# Patient Record
Sex: Female | Born: 1955 | Race: White | Hispanic: No | Marital: Married | Smoking: Former smoker
Health system: Southern US, Community
[De-identification: ages and names within clinical notes are randomized; demographics above are authoritative.]

## PROBLEM LIST (undated history)

## (undated) DIAGNOSIS — F419 Anxiety disorder, unspecified: Secondary | ICD-10-CM

## (undated) DIAGNOSIS — G459 Transient cerebral ischemic attack, unspecified: Secondary | ICD-10-CM

## (undated) DIAGNOSIS — I10 Essential (primary) hypertension: Secondary | ICD-10-CM

## (undated) DIAGNOSIS — Z85118 Personal history of other malignant neoplasm of bronchus and lung: Secondary | ICD-10-CM

## (undated) DIAGNOSIS — Z5189 Encounter for other specified aftercare: Secondary | ICD-10-CM

## (undated) DIAGNOSIS — C801 Malignant (primary) neoplasm, unspecified: Secondary | ICD-10-CM

## (undated) HISTORY — PX: BILATERAL OOPHORECTOMY: SHX1221

## (undated) HISTORY — PX: LUNG LOBECTOMY: SHX167

## (undated) HISTORY — PX: ABDOMINAL HYSTERECTOMY: SHX81

## (undated) HISTORY — PX: APPENDECTOMY: SHX54

---

## 2008-06-18 ENCOUNTER — Ambulatory Visit: Payer: Self-pay | Admitting: Thoracic Surgery (Cardiothoracic Vascular Surgery)

## 2016-10-06 ENCOUNTER — Emergency Department (HOSPITAL_BASED_OUTPATIENT_CLINIC_OR_DEPARTMENT_OTHER): Payer: BLUE CROSS/BLUE SHIELD

## 2016-10-06 ENCOUNTER — Encounter (HOSPITAL_BASED_OUTPATIENT_CLINIC_OR_DEPARTMENT_OTHER): Payer: Self-pay | Admitting: *Deleted

## 2016-10-06 ENCOUNTER — Emergency Department (HOSPITAL_BASED_OUTPATIENT_CLINIC_OR_DEPARTMENT_OTHER)
Admission: EM | Admit: 2016-10-06 | Discharge: 2016-10-06 | Disposition: A | Discharge status: Home / Self Care | Payer: BLUE CROSS/BLUE SHIELD | Attending: Emergency Medicine | Admitting: Emergency Medicine

## 2016-10-06 DIAGNOSIS — Z79899 Other long term (current) drug therapy: Secondary | ICD-10-CM | POA: Insufficient documentation

## 2016-10-06 DIAGNOSIS — I10 Essential (primary) hypertension: Secondary | ICD-10-CM | POA: Diagnosis not present

## 2016-10-06 DIAGNOSIS — R05 Cough: Secondary | ICD-10-CM | POA: Diagnosis present

## 2016-10-06 DIAGNOSIS — R110 Nausea: Secondary | ICD-10-CM | POA: Diagnosis not present

## 2016-10-06 DIAGNOSIS — Z85118 Personal history of other malignant neoplasm of bronchus and lung: Secondary | ICD-10-CM | POA: Diagnosis not present

## 2016-10-06 DIAGNOSIS — Z87891 Personal history of nicotine dependence: Secondary | ICD-10-CM | POA: Diagnosis not present

## 2016-10-06 DIAGNOSIS — R059 Cough, unspecified: Secondary | ICD-10-CM

## 2016-10-06 DIAGNOSIS — J029 Acute pharyngitis, unspecified: Secondary | ICD-10-CM | POA: Diagnosis not present

## 2016-10-06 HISTORY — DX: Malignant (primary) neoplasm, unspecified: C80.1

## 2016-10-06 HISTORY — DX: Essential (primary) hypertension: I10

## 2016-10-06 HISTORY — DX: Transient cerebral ischemic attack, unspecified: G45.9

## 2016-10-06 HISTORY — DX: Encounter for other specified aftercare: Z51.89

## 2016-10-06 HISTORY — DX: Anxiety disorder, unspecified: F41.9

## 2016-10-06 HISTORY — DX: Personal history of other malignant neoplasm of bronchus and lung: Z85.118

## 2016-10-06 LAB — RAPID STREP SCREEN (MED CTR MEBANE ONLY): Streptococcus, Group A Screen (Direct): NEGATIVE

## 2016-10-06 MED ORDER — DM-GUAIFENESIN ER 30-600 MG PO TB12: 1.0000 | ORAL_TABLET | Freq: Two times a day (BID) | ORAL | 0 refills | Status: AC

## 2016-10-06 MED ORDER — ALBUTEROL SULFATE HFA 108 (90 BASE) MCG/ACT IN AERS: 1.0000 | INHALATION_SPRAY | Freq: Four times a day (QID) | RESPIRATORY_TRACT | 0 refills | Status: AC | PRN

## 2016-10-06 MED ORDER — IBUPROFEN 800 MG PO TABS: 800.0000 mg | ORAL_TABLET | Freq: Three times a day (TID) | ORAL | 0 refills | Status: AC

## 2016-10-06 MED FILL — IBUPROFEN 800 MG TABLET: 800 | 7 days supply | Qty: 21 | Fill #0

## 2016-10-06 MED FILL — MUCINEX DM ER 600-30 MG TAB: 30-600 | 10 days supply | Qty: 20 | Fill #0

## 2016-10-06 NOTE — Discharge Instructions (Signed)
Medications: Ibuprofen, Mucinex DM, albuterol inhaler  Treatment: Take ibuprofen every 8 hours as needed for your pain. Take Mucinex twice daily for cough. Use inhaler every 4-6 hours as needed for shortness of breath or wheezing. You can gargle with warm salt water to soothe your throat as well.  Follow-up: Please follow-up with your primary care provider on Monday for follow-up and further evaluation and treatment of your symptoms. Please return to emergency department if you develop any new or worsening symptoms including worsening shortness of breath, fever, chest pain, or any other concerning symptoms.

## 2016-10-06 NOTE — ED Notes (Signed)
Patient transported to X-ray 

## 2016-10-06 NOTE — ED Provider Notes (Signed)
[facility] Provider Note   CSN: [id number] Arrival date & time: 10/06/16  1016     History   Chief Complaint Chief Complaint  Patient presents with  . Cough    HPI Robin Boone is a 60 y.o. female with history of lung cancer and lobectomy, in remission, who presents with a three-day history of sore throat and cough. Patient reports associated mild shortness of breath, worse with speaking and on exertion. Patient reports a burning sensation in her chest with coughing, but denies chest pain. Patient is also had intermittent nausea, however she feels that it is related to her sore throat. She denies any fevers, abdominal pain, vomiting, urinary symptoms. Patient reports that she has a history of strep throat with cough, as well as pneumonia and bronchitis that is exacerbated due to her lobectomy. Patient denies sick contacts.  HPI  Past Medical History:  Diagnosis Date  . Anxiety   . Blood transfusion without reported diagnosis   . Cancer ([place])   . History of lung cancer   . Hypertension   . TIA (transient ischemic attack)     There are no active problems to display for this patient.   Past Surgical History:  Procedure Laterality Date  . ABDOMINAL HYSTERECTOMY    . APPENDECTOMY    . BILATERAL OOPHORECTOMY    . LUNG LOBECTOMY Right     OB History    No data available       Home Medications    Prior to Admission medications   Medication Sig Start Date End Date Taking? Authorizing Provider  ALPRAZolam Duanne Moron) 1 MG tablet Take 1 mg by mouth 3 (three) times daily as needed for anxiety.   Yes Historical Provider, MD  Bepotastine Besilate 1.5 % SOLN    Yes Historical Provider, MD  cycloSPORINE (RESTASIS) 0.05 % ophthalmic emulsion Place 1 drop into both eyes 2 (two) times daily.   Yes Historical Provider, MD  desloratadine (CLARINEX) 5 MG tablet Take 5 mg by mouth daily.   Yes Historical Provider, MD  estradiol (CLIMARA - DOSED IN MG/24 HR) 0.1  mg/24hr patch Place 0.1 mg onto the skin once a week.   Yes Historical Provider, MD  fluticasone (FLONASE) 50 MCG/ACT nasal spray Place into both nostrils as needed for allergies or rhinitis.   Yes Historical Provider, MD  montelukast (SINGULAIR) 10 MG tablet Take 10 mg by mouth at bedtime.   Yes Historical Provider, MD  Multiple Vitamin (MULTIVITAMIN) tablet Take 1 tablet by mouth daily.   Yes Historical Provider, MD  nebivolol (BYSTOLIC) 5 MG tablet Take 5 mg by mouth daily.   Yes Historical Provider, MD  OMEGA-3 FATTY ACIDS PO Take by mouth daily.   Yes Historical Provider, MD  albuterol (PROVENTIL HFA;VENTOLIN HFA) 108 (90 Base) MCG/ACT inhaler Inhale 1-2 puffs into the lungs every 6 (six) hours as needed for wheezing or shortness of breath. 10/06/16   Frederica Kuster, PA-C  dextromethorphan-guaiFENesin (MUCINEX DM) 30-600 MG 12hr tablet Take 1 tablet by mouth 2 (two) times daily. 10/06/16   Frederica Kuster, PA-C  ibuprofen (ADVIL,MOTRIN) 800 MG tablet Take 1 tablet (800 mg total) by mouth 3 (three) times daily. 10/06/16   Frederica Kuster, PA-C    Family History No family history on file.  Social History Social History  Substance Use Topics  . Smoking status: Former Research scientist (life sciences)  . Smokeless tobacco: Never Used  . Alcohol use Yes     Comment: 2-3 daily  Allergies   Taxol [paclitaxel]   Review of Systems Review of Systems  Constitutional: Negative for chills and fever.  HENT: Positive for sore throat. Negative for congestion and facial swelling.   Respiratory: Positive for cough and shortness of breath.   Cardiovascular: Negative for chest pain.  Gastrointestinal: Positive for nausea. Negative for abdominal pain and vomiting.  Genitourinary: Negative for dysuria.  Musculoskeletal: Negative for back pain.  Skin: Negative for rash and wound.  Neurological: Negative for headaches.  Psychiatric/Behavioral: The patient is not nervous/anxious.      Physical Exam Updated Vital  Signs BP 163/86 (BP Location: Left Arm)   Pulse 82   Temp 98.1 F (36.7 C) (Oral)   Resp 18   Ht 5' (1.524 m)   Wt 65.3 kg   SpO2 100%   BMI 28.12 kg/m   Physical Exam  Constitutional: She appears well-developed and well-nourished. No distress.  HENT:  Head: Normocephalic and atraumatic.  Mouth/Throat: Mucous membranes are normal. No trismus in the jaw. No uvula swelling. Oropharyngeal exudate and posterior oropharyngeal erythema present. No posterior oropharyngeal edema or tonsillar abscesses.  Eyes: Conjunctivae are normal. Pupils are equal, round, and reactive to light. Right eye exhibits no discharge. Left eye exhibits no discharge. No scleral icterus.  Neck: Normal range of motion. Neck supple. No thyromegaly present.  Cardiovascular: Normal rate, regular rhythm, normal heart sounds and intact distal pulses.  Exam reveals no gallop and no friction rub.   No murmur heard. Pulmonary/Chest: Effort normal and breath sounds normal. No stridor. No respiratory distress. She has no wheezes. She has no rales.  Abdominal: Soft. Bowel sounds are normal. She exhibits no distension. There is no tenderness. There is no rebound and no guarding.  Musculoskeletal: She exhibits no edema.  Lymphadenopathy:    She has no cervical adenopathy.  Neurological: She is alert. Coordination normal.  Skin: Skin is warm and dry. No rash noted. She is not diaphoretic. No pallor.  Psychiatric: She has a normal mood and affect.  Nursing note and vitals reviewed.    ED Treatments / Results  Labs (all labs ordered are listed, but only abnormal results are displayed) Labs Reviewed  RAPID STREP SCREEN (NOT AT [facility])  CULTURE, GROUP A STREP [facility])    EKG  EKG Interpretation None       Radiology Dg Chest 2 View  Result Date: 10/06/2016 CLINICAL DATA:  Cough 3 days. History of right lung lobectomy for lung cancer. EXAM: CHEST  2 VIEW COMPARISON:  None. FINDINGS: Normal heart size. Normal mediastinal  contour. No pneumothorax. No pleural effusion. Healed thoracotomy deformity in the posterior right upper ribs. Surgical sutures overlie the right hilum and right mid lung. Mild volume loss in the right hemithorax. Tiny scar at the right lung base. No pulmonary edema. No acute consolidative airspace disease. IMPRESSION: 1. No acute consolidative airspace disease to suggest a pneumonia. 2. Postsurgical changes in the right hemithorax as described. Electronically Signed   By: Ilona Sorrel M.D.   On: 10/06/2016 11:17    Procedures Procedures (including critical care time)  Medications Ordered in ED Medications - No data to display   Initial Impression / Assessment and Plan / ED Course  I have reviewed the triage vital signs and the nursing notes.  Pertinent labs & imaging results that were available during my care of the patient were reviewed by me and considered in my medical decision making (see chart for details).  Clinical Course     Pt  symptoms consistent with viral URI. CXR negative for acute infiltrate and rapid strep negative. Stable vitals and patient in no acute distress. Pt will be discharged with symptomatic treatment including Mucinex DM, ibuprofen, albuterol inhaler.  Discussed strict return precautions. Follow-up to PCP in 3 days. Patient understands and agrees with plan. Pt is hemodynamically stable & in NAD prior to discharge. I discussed patient case with Dr. Oleta Mouse who agrees with plan.   Final Clinical Impressions(s) / ED Diagnoses   Final diagnoses:  Cough  Sore throat    New Prescriptions New Prescriptions   ALBUTEROL (PROVENTIL HFA;VENTOLIN HFA) 108 (90 BASE) MCG/ACT INHALER    Inhale 1-2 puffs into the lungs every 6 (six) hours as needed for wheezing or shortness of breath.   DEXTROMETHORPHAN-GUAIFENESIN (MUCINEX DM) 30-600 MG 12HR TABLET    Take 1 tablet by mouth 2 (two) times daily.   IBUPROFEN (ADVIL,MOTRIN) 800 MG TABLET    Take 1 tablet (800 mg total) by mouth 3  (three) times daily.     Frederica Kuster, PA-C 10/06/16 [place], MD 10/06/16 1600

## 2016-10-06 NOTE — ED Triage Notes (Signed)
Cough, sore throat, congestion x 3 days. Denies fever

## 2016-10-09 LAB — CULTURE, GROUP A STREP (THRC)

## 2017-11-10 IMAGING — CR DG CHEST 2V
2 series · 2 of 2 positions shown · non-contrast
Comparison: None.

CLINICAL DATA: Cough 3 days. History of right lung lobectomy for
lung cancer.

EXAM:
CHEST  2 VIEW

[w chest pa]
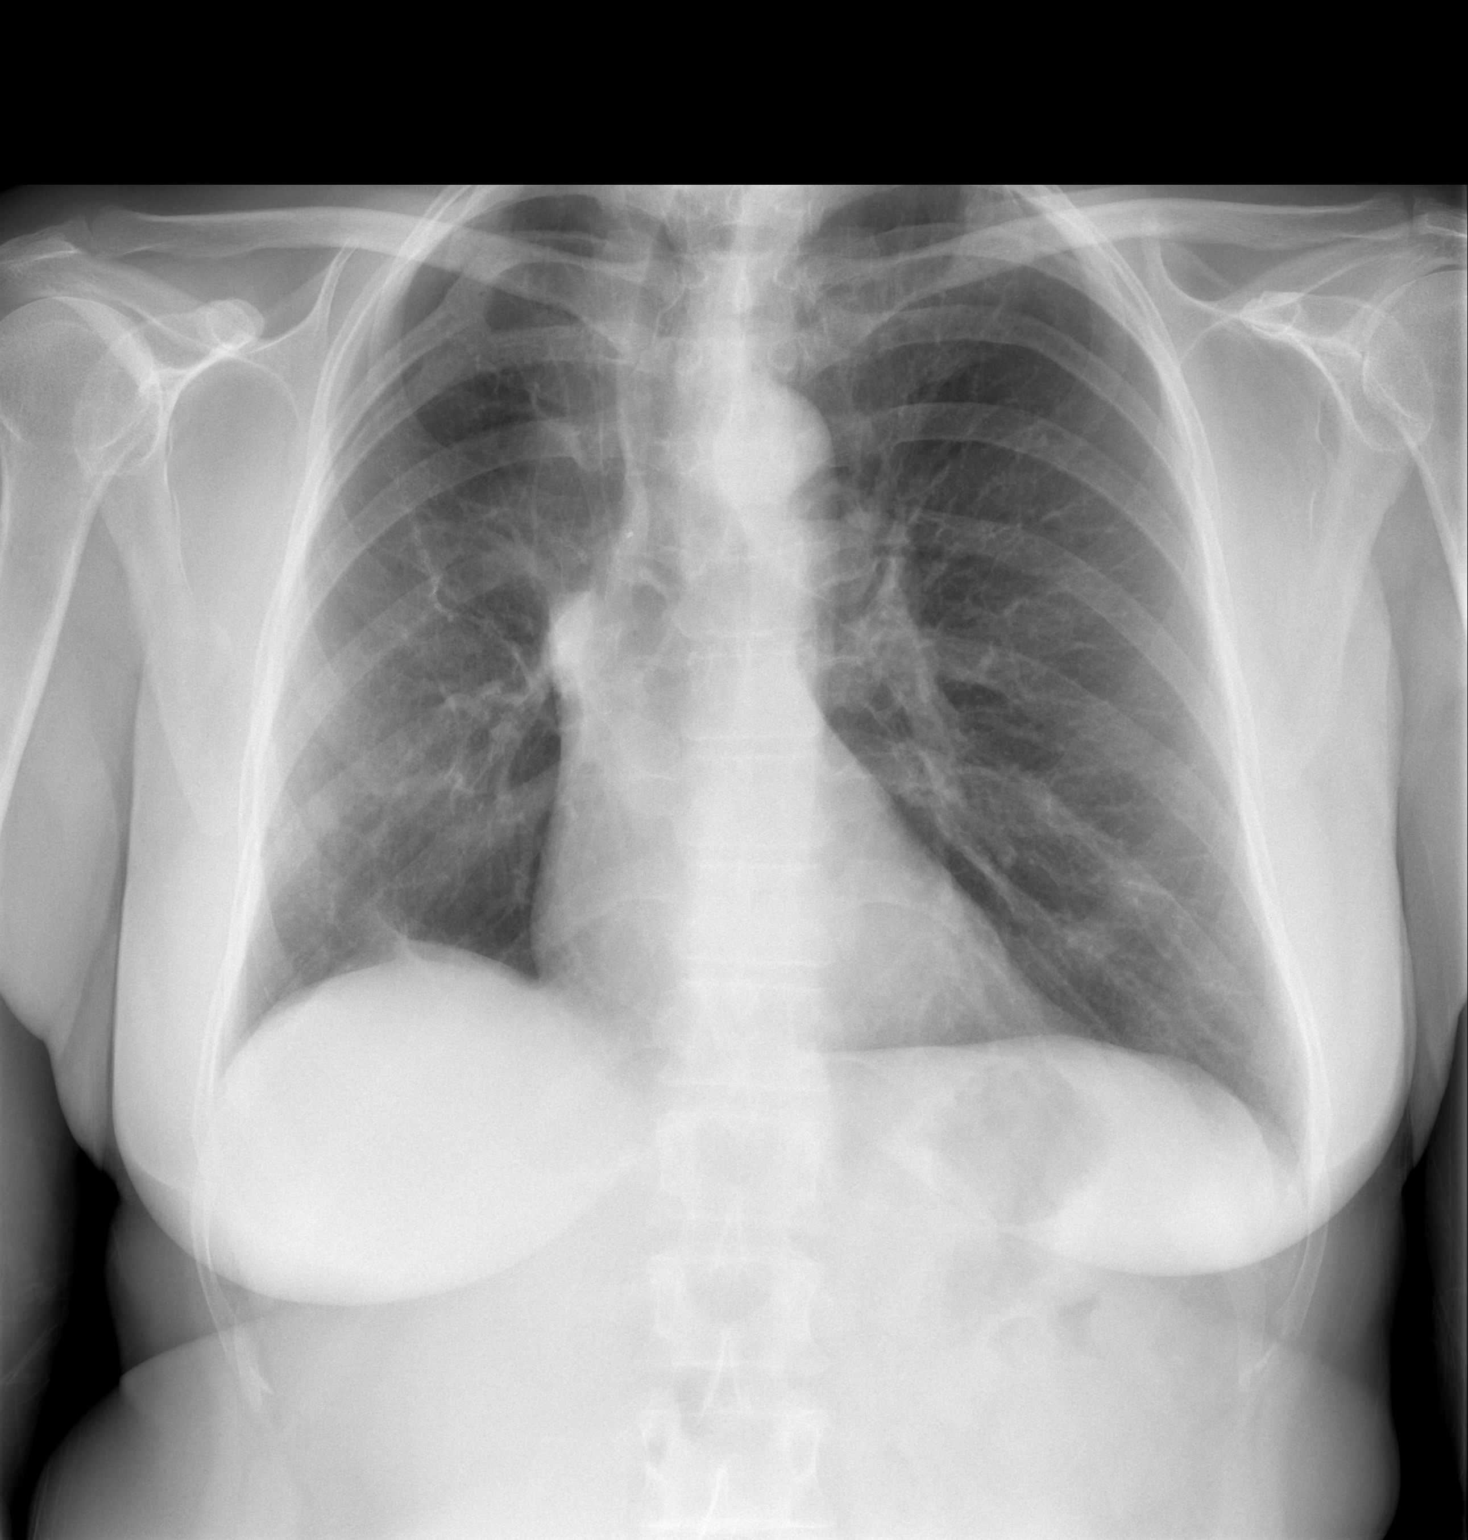

[w chest lat]
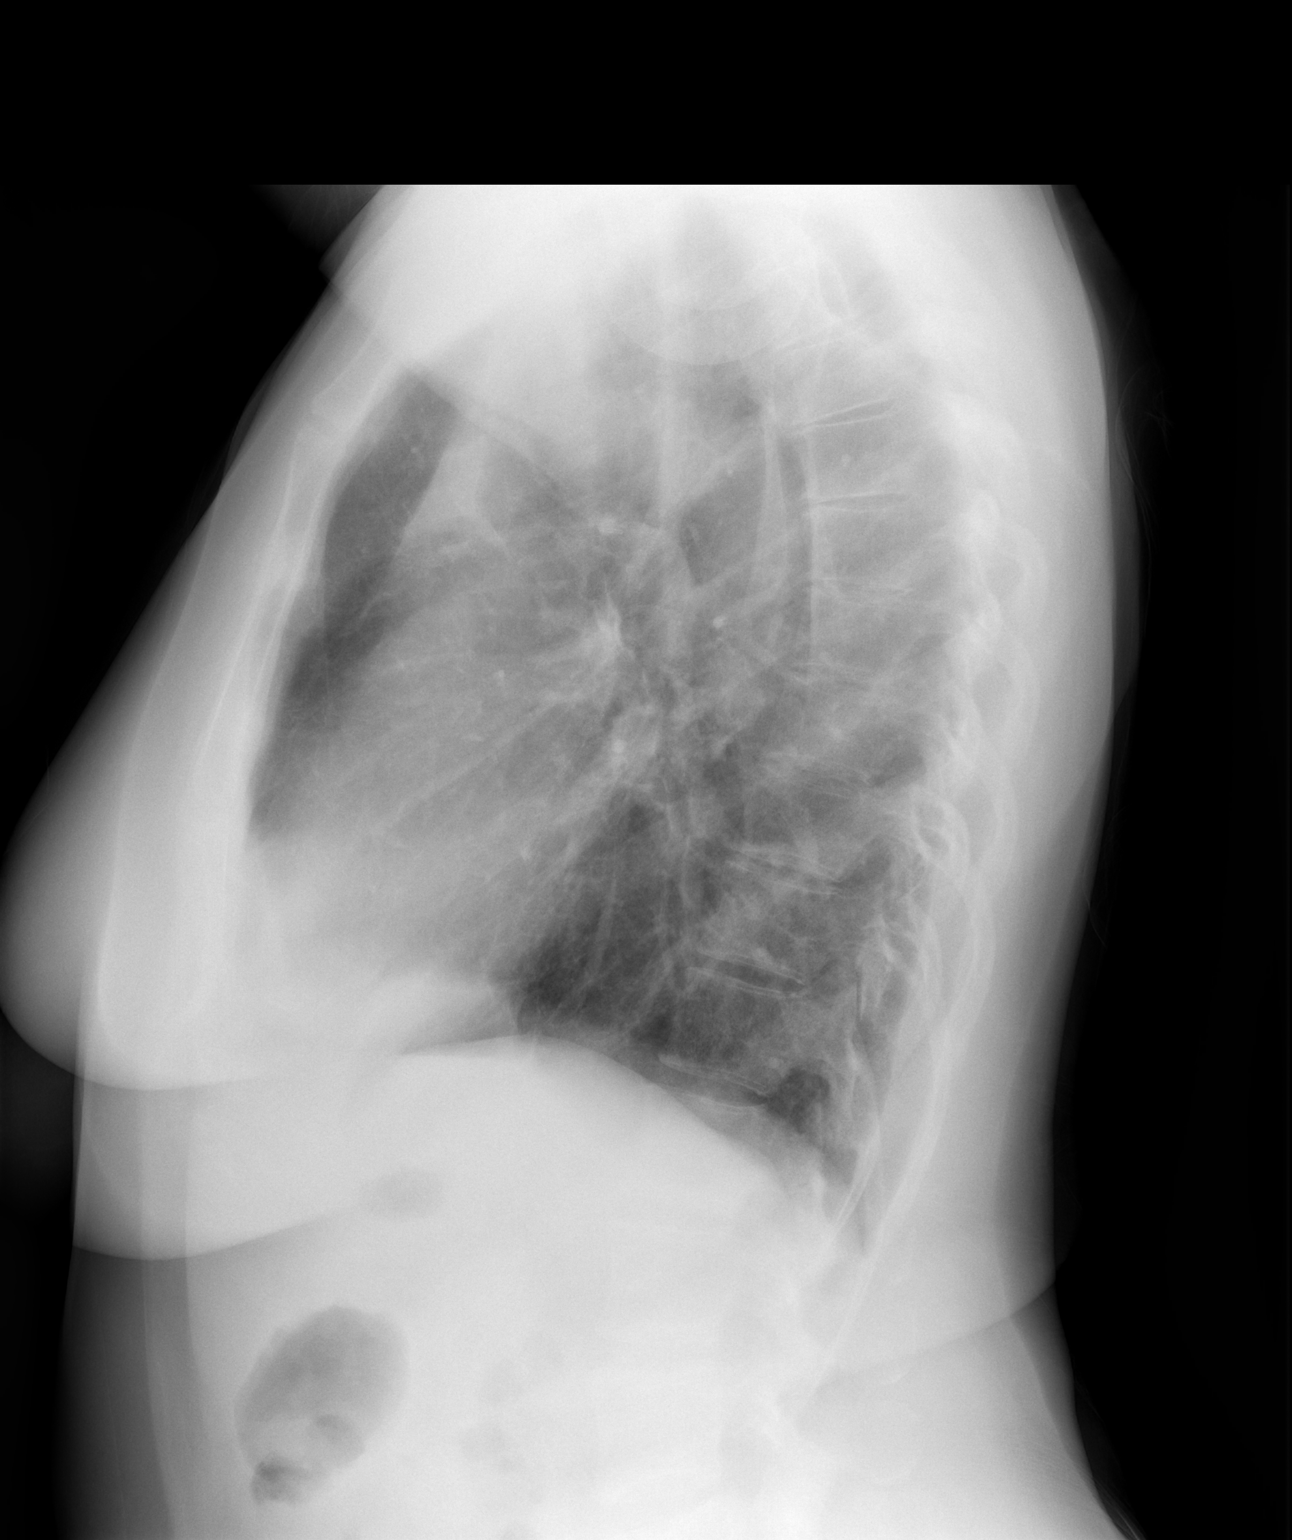

[2 of 2 positions shown; findings below may reference images not displayed]

FINDINGS: Normal heart size. Normal mediastinal contour. No pneumothorax. No
pleural effusion. Healed thoracotomy deformity in the posterior
right upper ribs. Surgical sutures overlie the right hilum and right
mid lung. Mild volume loss in the right hemithorax. Tiny scar at the
right lung base. No pulmonary edema. No acute consolidative airspace
disease.
IMPRESSION: 1. No acute consolidative airspace disease to suggest a pneumonia.
2. Postsurgical changes in the right hemithorax as described.
